# Patient Record
Sex: Male | Born: 1975 | Hispanic: Yes | Smoking: Never smoker
Health system: Southern US, Community
[De-identification: ages and names within clinical notes are randomized; demographics above are authoritative.]

---

## 2014-12-24 ENCOUNTER — Encounter: Payer: Self-pay | Admitting: *Deleted

## 2014-12-24 ENCOUNTER — Emergency Department
Admission: EM | Admit: 2014-12-24 | Discharge: 2014-12-24 | Disposition: A | Discharge status: Home / Self Care | Payer: BLUE CROSS/BLUE SHIELD | Source: Home / Self Care | Attending: Emergency Medicine | Admitting: Emergency Medicine

## 2014-12-24 DIAGNOSIS — J0300 Acute streptococcal tonsillitis, unspecified: Secondary | ICD-10-CM

## 2014-12-24 LAB — POCT RAPID STREP A (OFFICE): Rapid Strep A Screen: POSITIVE — AB

## 2014-12-24 MED ORDER — AMOXICILLIN 875 MG PO TABS: ORAL_TABLET | ORAL | Status: DC

## 2014-12-24 MED ORDER — PENICILLIN G BENZATHINE 1200000 UNIT/2ML IM SUSP
1.2000 10*6.[IU] | Freq: Once | INTRAMUSCULAR | Status: AC
  Administered 2014-12-24: 1.2 10*6.[IU] via INTRAMUSCULAR

## 2014-12-24 NOTE — Discharge Instructions (Signed)
Strep Throat Strep throat is an infection of the throat caused by a bacteria named Streptococcus pyogenes. Your health care provider may call the infection streptococcal "tonsillitis" or "pharyngitis" depending on whether there are signs of inflammation in the tonsils or back of the throat. Strep throat is most common in children aged 40-15 years during the cold months of the year, but it can occur in people of any age during any season. This infection is spread from person to person (contagious) through coughing, sneezing, or other close contact. SIGNS AND SYMPTOMS   Fever or chills.  Painful, swollen, red tonsils or throat.  Pain or difficulty when swallowing.  White or yellow spots on the tonsils or throat.  Swollen, tender lymph nodes or "glands" of the neck or under the jaw.  Red rash all over the body (rare). DIAGNOSIS  Many different infections can cause the same symptoms. A test must be done to confirm the diagnosis so the right treatment can be given. A "rapid strep test" can help your health care provider make the diagnosis in a few minutes. Rapid strep test today is positive   TREATMENT  Strep throat is treated with antibiotic medicine. Today, we gave you a shot of penicillin (Bicillin LA 1.64M units). Also, fill the prescription for amoxicillin by mouth twice a day for 10 days HOME CARE INSTRUCTIONS     Gargle with 1 tsp of salt in 1 cup of warm water, 3-4 times per day or as needed for comfort.  Take 2 ibuprofen every 6 hours as needed for pain or fever  Family members who also have a sore throat or fever should be tested for strep throat and treated with antibiotics if they have the strep infection.  Make sure everyone in your household washes their hands well.  Do not share food, drinking cups, or personal items that could cause the infection to spread to others.  You may need to eat a soft food diet until your sore throat gets better.  Drink enough water and fluids  to keep your urine clear or pale yellow. This will help prevent dehydration.  Get plenty of rest.  Stay home from school, day care, or work until you have been on antibiotics for 24 hours.  Take medicines only as directed by your health care provider.  Take your antibiotic medicine as directed by your health care provider. Finish it even if you start to feel better. SEEK MEDICAL CARE IF:   The glands in your neck continue to enlarge.  You develop a rash, cough, or earache.  You cough up green, yellow-brown, or bloody sputum.  You have pain or discomfort not controlled by medicines.  Your problems seem to be getting worse rather than better.  You have a fever. SEEK IMMEDIATE MEDICAL CARE IF:   You develop any new symptoms such as vomiting, severe headache, stiff or painful neck, chest pain, shortness of breath, or trouble swallowing.  You develop severe throat pain, drooling, or changes in your voice.  You develop swelling of the neck, or the skin on the neck becomes red and tender.  You develop signs of dehydration, such as fatigue, dry mouth, and decreased urination.  You become increasingly sleepy, or you cannot wake up completely. MAKE SURE YOU:  Understand these instructions.  Will watch your condition.  Will get help right away if you are not doing well or get worse. Document Released: 08/30/2000 Document Revised: 01/17/2014 Document Reviewed: 11/01/2010 [facility] Patient Information 2015 [employer] [place].  This information is not intended to replace advice given to you by your health care provider. Make sure you discuss any questions you have with your health care provider. ° °

## 2014-12-24 NOTE — ED Notes (Signed)
Pt complains of sore throat fever and body aches since last night.  He took Advil at 1pm for the fever and it brought it down a bit.

## 2014-12-24 NOTE — ED Provider Notes (Signed)
CSN: [id number]     Arrival date & time 12/24/14  1706 History   First MD Initiated Contact with Patient 12/24/14 1722     Chief Complaint  Patient presents with  . Fever  . Sore Throat    HPI SORE THROAT Onset: 1 day    Severity: Severe Tried OTC meds without significant relief.  Symptoms:  + High fever and chills and sweats + Swollen neck glands No Recent Strep Exposure     Mild Myalgias. No arthralgias. No Headache No Rash  No Discolored Nasal Mucus No Allergy symptoms No sinus pain/pressure No itchy/red eyes No earache  No Drooling No Trismus  No Nausea No Vomiting No Abdominal pain No Diarrhea No Reflux symptoms  No Cough No Breathing Difficulty No Shortness of Breath No pleuritic pain No Wheezing No Hemoptysis   History reviewed. No pertinent past medical history. History reviewed. No pertinent past surgical history. History reviewed. No pertinent family history. History  Substance Use Topics  . Smoking status: Never Smoker   . Smokeless tobacco: Never Used  . Alcohol Use: No    Review of Systems Remainder of Review of Systems negative for acute change except as noted in the HPI.  Allergies  Review of patient's allergies indicates no known allergies.  Home Medications   Prior to Admission medications   Medication Sig Start Date End Date Taking? Authorizing Provider  amoxicillin (AMOXIL) 875 MG tablet Take 1 twice a day X 10 days. 12/24/14   Lajean Manesavid Massey, MD   BP 129/66 mmHg  Pulse 103  Temp(Src) 100.1 F (37.8 C) (Oral)  Ht 5\' 9"  (1.753 m)  SpO2 99% Physical Exam  Constitutional: He is oriented to person, place, and time. He appears well-developed and well-nourished.  Non-toxic appearance. He appears ill. No distress.  HENT:  Head: Normocephalic and atraumatic.  Right Ear: Tympanic membrane, external ear and ear canal normal.  Left Ear: Tympanic membrane, external ear and ear canal normal.  Nose: Nose normal. Right sinus exhibits no  maxillary sinus tenderness and no frontal sinus tenderness. Left sinus exhibits no maxillary sinus tenderness and no frontal sinus tenderness.  Mouth/Throat: Uvula is midline and mucous membranes are normal. No oral lesions. Posterior oropharyngeal erythema present. No oropharyngeal exudate or tonsillar abscesses.  + Tonsillar enlargement.  Airway intact.  Eyes: Conjunctivae are normal. No scleral icterus.  Neck: Neck supple.  Cardiovascular: Normal rate, regular rhythm and normal heart sounds.   No murmur heard. Pulmonary/Chest: Effort normal and breath sounds normal. No stridor. No respiratory distress. He has no wheezes. He has no rhonchi. He has no rales.  Abdominal: Soft. He exhibits no mass. There is no hepatosplenomegaly. There is no tenderness.  Lymphadenopathy:    He has cervical adenopathy.       Right cervical: Superficial cervical adenopathy present. No deep cervical and no posterior cervical adenopathy present.      Left cervical: Superficial cervical adenopathy present. No deep cervical and no posterior cervical adenopathy present.  Neurological: He is alert and oriented to person, place, and time.  Skin: Skin is warm. No rash noted.  Psychiatric: He has a normal mood and affect.  Nursing note and vitals reviewed.   ED Course  Procedures (including critical care time) Labs Review Labs Reviewed  POCT RAPID STREP A (OFFICE) - Abnormal; Notable for the following:    Rapid Strep A Screen Positive (*)    All other components within normal limits    Imaging Review No results found.  MDM   1. Strep tonsillitis    Treatment options discussed, as well as risks, benefits, alternatives. Patient voiced understanding and agreement with the following plans: Bicillin LA 1.2 million units IM stat Amoxicillin 875 twice a day 10 days See detailed Instructions in AVS, which were given to patient. Verbal instructions also given. Risks, benefits, and alternatives of treatment  options discussed. Questions invited and answered. Patient voiced understanding and agreement with plans.     Lajean Manes, MD 12/24/14 [id number]

## 2016-06-18 ENCOUNTER — Encounter: Payer: Self-pay | Admitting: *Deleted

## 2016-06-18 ENCOUNTER — Emergency Department
Admission: EM | Admit: 2016-06-18 | Discharge: 2016-06-18 | Disposition: A | Discharge status: Home / Self Care | Payer: BLUE CROSS/BLUE SHIELD | Source: Home / Self Care | Attending: Family Medicine | Admitting: Family Medicine

## 2016-06-18 DIAGNOSIS — M545 Low back pain, unspecified: Secondary | ICD-10-CM

## 2016-06-18 MED ORDER — MELOXICAM 7.5 MG PO TABS: 7.5000 mg | ORAL_TABLET | Freq: Every day | ORAL | 0 refills | Status: DC

## 2016-06-18 MED ORDER — CYCLOBENZAPRINE HCL 10 MG PO TABS: 10.0000 mg | ORAL_TABLET | Freq: Two times a day (BID) | ORAL | 0 refills | Status: DC | PRN

## 2016-06-18 NOTE — ED Triage Notes (Signed)
Pt c/o 2 weeks of low back pain. He fell several years causing low back pain that is now intermittent. This flare has lasted 2 weeks. Taking IBF with minimal relief. He is currently seeing a chiropractor

## 2016-06-18 NOTE — Discharge Instructions (Signed)
°  Flexeril is a muscle relaxer and may cause drowsiness. Do not drink alcohol, drive, or operate heavy machinery while taking. ° °Meloxicam (Mobic) is an antiinflammatory to help with pain and inflammation.  Do not take ibuprofen, Advil, Aleve, or any other medications that contain NSAIDs while taking meloxicam as this may cause stomach upset or even ulcers if taken in large amounts for an extended period of time.  ° °

## 2016-06-18 NOTE — ED Provider Notes (Signed)
CSN: [id number]     Arrival date & time 06/18/16  1900 History   First MD Initiated Contact with Patient 06/18/16 1920     Chief Complaint  Patient presents with  . Back Pain   (Consider location/radiation/quality/duration/timing/severity/associated sxs/prior Treatment) HPI Leroy Parks is a 40 y.o. male presenting to UC with c/o lower back pain that started about 2 weeks ago.  He states he goes to the gym and initially thought that caused the pain but that pain typically goes away within 2-3 days.  Pain was initially on Left lower back and radiated down Left leg.  Pain is now across his lower back but does not radiate into his legs. Pain is 9/10, aching and sore, worse with certain movements.  He notes he fell several years ago and has been seeing a chiropractor Mondays Tuesdays and Thursdays.  He does get some relief but was advised by his chiropractor that he should see a physician for prescription antiinflammatory medication.  Denies fever, chills, numbness or tingling in legs. No change in bowel or bladder movements. No known recent injury.   History reviewed. No pertinent past medical history. History reviewed. No pertinent surgical history. History reviewed. No pertinent family history. Social History  Substance Use Topics  . Smoking status: Never Smoker  . Smokeless tobacco: Never Used  . Alcohol use No    Review of Systems  Genitourinary: Negative for dysuria, flank pain, frequency and hematuria.  Musculoskeletal: Positive for back pain and myalgias. Negative for arthralgias, gait problem and joint swelling.  Skin: Negative for color change and rash.  Neurological: Negative for weakness and numbness.    Allergies  Review of patient's allergies indicates no known allergies.  Home Medications   Prior to Admission medications   Medication Sig Start Date End Date Taking? Authorizing Provider  cyclobenzaprine (FLEXERIL) 10 MG tablet Take 1 tablet (10 mg total) by mouth 2 (two)  times daily as needed for muscle spasms. 06/18/16   Junius Finner, PA-C  meloxicam (MOBIC) 7.5 MG tablet Take 1-2 tablets (7.5-15 mg total) by mouth daily. 06/18/16   Junius Finner, PA-C   Meds Ordered and Administered this Visit  Medications - No data to display  BP 127/75 (BP Location: Left Arm)   Pulse 82   Temp 98.7 F (37.1 C) (Oral)   Wt 216 lb (98 kg)   SpO2 98%   BMI 31.90 kg/m  No data found.   Physical Exam  Constitutional: He is oriented to person, place, and time. He appears well-developed and well-nourished. No distress.  HENT:  Head: Normocephalic and atraumatic.  Eyes: EOM are normal.  Neck: Normal range of motion.  Cardiovascular: Normal rate.   Pulmonary/Chest: Effort normal.  Musculoskeletal: Normal range of motion. He exhibits tenderness. He exhibits no edema.  Tenderness over SI joint and across lower lumbar muscles.  Full ROM upper and lower extremities with 5/5 strength. Negative straight leg raise.   Neurological: He is alert and oriented to person, place, and time.  Skin: Skin is warm and dry. No rash noted. He is not diaphoretic.  Psychiatric: He has a normal mood and affect. His behavior is normal.  Nursing note and vitals reviewed.   Urgent Care Course   Clinical Course    Procedures (including critical care time)  Labs Review Labs Reviewed - No data to display  Imaging Review No results found.    MDM   1. Acute bilateral low back pain without sciatica    Lower back  pain w/o known cause. No red flag symptoms. Hx of back pain in the past.  No indication for imaging at this time. Will try conservative treatment.  Rx: Flexeril and Meloxicam Encouraged alternating cool and warm compresses. Pt info with home exercises provided. F/u with PCP/Sports Medicine in 1-2 weeks if not improving. Patient verbalized understanding and agreement with treatment plan.     Junius Finnerrin O'Malley, PA-C 06/18/16 1936

## 2016-06-20 ENCOUNTER — Encounter: Payer: Self-pay | Admitting: Emergency Medicine

## 2016-06-20 ENCOUNTER — Emergency Department
Admission: EM | Admit: 2016-06-20 | Discharge: 2016-06-20 | Disposition: A | Discharge status: Home / Self Care | Payer: BLUE CROSS/BLUE SHIELD | Source: Home / Self Care | Attending: Family Medicine | Admitting: Family Medicine

## 2016-06-20 ENCOUNTER — Emergency Department (INDEPENDENT_AMBULATORY_CARE_PROVIDER_SITE_OTHER): Payer: BLUE CROSS/BLUE SHIELD

## 2016-06-20 DIAGNOSIS — M545 Low back pain: Secondary | ICD-10-CM | POA: Diagnosis not present

## 2016-06-20 DIAGNOSIS — M5441 Lumbago with sciatica, right side: Secondary | ICD-10-CM

## 2016-06-20 DIAGNOSIS — M5442 Lumbago with sciatica, left side: Secondary | ICD-10-CM | POA: Diagnosis not present

## 2016-06-20 MED ORDER — PREDNISONE 20 MG PO TABS: ORAL_TABLET | ORAL | 0 refills | Status: DC

## 2016-06-20 MED ORDER — METHYLPREDNISOLONE SODIUM SUCC 125 MG IJ SOLR
125.0000 mg | Freq: Once | INTRAMUSCULAR | Status: AC
  Administered 2016-06-20: 125 mg via INTRAMUSCULAR

## 2016-06-20 MED ORDER — HYDROCODONE-ACETAMINOPHEN 5-325 MG PO TABS: ORAL_TABLET | ORAL | 0 refills | Status: DC

## 2016-06-20 NOTE — Discharge Instructions (Signed)
Begin prednisone Friday, 06/21/16.  Apply ice pack for 20 to 30 minutes, 3 to 4 times daily.  Continue until pain decreases. Avoid lifting.

## 2016-06-20 NOTE — ED Provider Notes (Signed)
[facility]    CSN: [id number] Arrival date & time: 06/20/16  1921     History   Chief Complaint Chief Complaint  Patient presents with  . Back Pain    HPI Leroy Parks is a 39 y.o. male.   Patient complains of midline lower back pain for about two weeks since he has been working out in a gym.  He was evaluated here 2 days ago (see note 06/18/16) with non-radiating lower back pain.  He states that he has had no improvement from Flexeril and meloxicam.  Since his previous visit he has developed pain in both posterior calves, and he has had too much pain to perform stretching exercises.   He denies bowel or bladder dysfunction, and no saddle numbness.  He reports that his pain has worsened after chiropractic treatments.   The history is provided by the patient.  Back Pain  Location:  Lumbar spine Quality:  Aching Radiates to:  R posterior upper leg and L posterior upper leg Pain severity:  Severe Pain is:  Same all the time Onset quality:  Gradual Duration:  2 weeks Timing:  Constant Progression:  Worsening Chronicity:  New Context comment:  Athletic activity Relieved by:  Nothing Worsened by:  Movement Ineffective treatments:  NSAIDs and muscle relaxants Associated symptoms: leg pain and paresthesias   Associated symptoms: no abdominal pain, no bladder incontinence, no bowel incontinence, no dysuria, no fever, no numbness, no pelvic pain, no perianal numbness, no tingling, no weakness and no weight loss     History reviewed. No pertinent past medical history.  There are no active problems to display for this patient.   History reviewed. No pertinent surgical history.     Home Medications    Prior to Admission medications   Medication Sig Start Date End Date Taking? Authorizing Provider  cyclobenzaprine (FLEXERIL) 10 MG tablet Take 1 tablet (10 mg total) by mouth 2 (two) times daily as needed for muscle spasms. 06/18/16   Junius Finner, PA-C    HYDROcodone-acetaminophen (NORCO/VICODIN) 5-325 MG tablet Take one by mouth at bedtime as needed for pain 06/20/16   Lattie Haw, MD  meloxicam (MOBIC) 7.5 MG tablet Take 1-2 tablets (7.5-15 mg total) by mouth daily. 06/18/16   Junius Finner, PA-C  predniSONE (DELTASONE) 20 MG tablet Take one tab by mouth twice daily for 5 days, then one daily for 4 days. Take with food. 06/20/16   Lattie Haw, MD    Family History No family history on file.  Social History Social History  Substance Use Topics  . Smoking status: Never Smoker  . Smokeless tobacco: Never Used  . Alcohol use No     Allergies   Review of patient's allergies indicates no known allergies.   Review of Systems Review of Systems  Constitutional: Negative for fever and weight loss.  Gastrointestinal: Negative for abdominal pain and bowel incontinence.  Genitourinary: Negative for bladder incontinence, dysuria and pelvic pain.  Musculoskeletal: Positive for back pain.  Neurological: Positive for paresthesias. Negative for tingling, weakness and numbness.  All other systems reviewed and are negative.    Physical Exam Triage Vital Signs ED Triage Vitals  Enc Vitals Group     BP 06/20/16 1943 122/74     Pulse Rate 06/20/16 1943 81     Resp --      Temp 06/20/16 1943 98.4 F (36.9 C)     Temp Source 06/20/16 1943 Oral     SpO2 06/20/16 1943  98 %     Weight 06/20/16 1943 213 lb (96.6 kg)     Height --      Head Circumference --      Peak Flow --      Pain Score 06/20/16 1944 10     Pain Loc --      Pain Edu? --      Excl. in GC? --    No data found.   Updated Vital Signs BP 122/74 (BP Location: Left Arm)   Pulse 81   Temp 98.4 F (36.9 C) (Oral)   Wt 213 lb (96.6 kg)   SpO2 98%   BMI 31.45 kg/m   Visual Acuity Right Eye Distance:   Left Eye Distance:   Bilateral Distance:    Right Eye Near:   Left Eye Near:    Bilateral Near:     Physical Exam  Constitutional: He appears  well-developed and well-nourished. No distress.  HENT:  Head: Normocephalic.  Nose: Nose normal.  Mouth/Throat: Oropharynx is clear and moist.  Eyes: Conjunctivae are normal. Pupils are equal, round, and reactive to light.  Neck: Normal range of motion.  Cardiovascular: Normal heart sounds.   Pulmonary/Chest: Breath sounds normal.  Abdominal: Bowel sounds are normal. There is no tenderness.  Musculoskeletal: He exhibits no edema.       Back:  Back:   Can heel/toe walk and squat without difficulty.  Decreased forward flexion.  Tenderness in the midline from L5 and below.  Straight leg raising test is positive at 45 degrees.  Sitting knee extension test is positive at 45 degrees.  Strength and sensation in the lower extremities is normal.  Patellar reflexes are normal with augmentation.  Achilles reflexes are normal   Neurological: He is alert. He has normal reflexes.  Skin: Skin is warm and dry. No rash noted.     Patient's area of pain outlined on diagram, although there is no tenderness to palpation in these areas.  Nursing note and vitals reviewed.    UC Treatments / Results  Labs (all labs ordered are listed, but only abnormal results are displayed) Labs Reviewed - No data to display  EKG  EKG Interpretation None       Radiology Dg Lumbar Spine Complete  Result Date: 06/20/2016 CLINICAL DATA:  Central lower back pain at approximately the L5 level for the past 2 weeks. This started after a new exercise at the gym. No known injury. EXAM: LUMBAR SPINE - COMPLETE 4+ VIEW COMPARISON:  None. FINDINGS: Five non-rib-bearing lumbar vertebrae. Mild anterior spur formation at the L3-4 level. Minimal anterior spur formation in the lower thoracic spine. No fractures, pars defects or subluxations. IMPRESSION: Minimal degenerative changes.  No acute abnormality. Electronically Signed   By: Beckie SaltsSteven  Reid M.D.   On: 06/20/2016 20:46    Procedures Procedures (including critical care  time)  Medications Ordered in UC Medications  methylPREDNISolone sodium succinate (SOLU-MEDROL) 125 mg/2 mL injection 125 mg (not administered)     Initial Impression / Assessment and Plan / UC Course  I have reviewed the triage vital signs and the nursing notes.  Pertinent labs & imaging results that were available during my care of the patient were reviewed by me and considered in my medical decision making (see chart for details).  Clinical Course  Administered Solumedrol 125mg  IM  Begin prednisone burst/taper Friday, 06/21/16.  Apply ice pack for 20 to 30 minutes, 3 to 4 times daily.  Continue until pain decreases. Avoid lifting.  Rx for Lortab for pain. Followup with Dr. Rodney Langton or Dr. Clementeen Graham ([facility]) in one week.     Final Clinical Impressions(s) / UC Diagnoses   Final diagnoses:  Acute low back pain with bilateral sciatica, unspecified back pain laterality    New Prescriptions New Prescriptions   HYDROCODONE-ACETAMINOPHEN (NORCO/VICODIN) 5-325 MG TABLET    Take one by mouth at bedtime as needed for pain   PREDNISONE (DELTASONE) 20 MG TABLET    Take one tab by mouth twice daily for 5 days, then one daily for 4 days. Take with food.     Lattie Haw, MD 06/22/16 2329

## 2016-06-20 NOTE — ED Triage Notes (Signed)
Low back pain radiating down left leg x 2 weeks, was seen here 2 days ago in too much pain to do stretching exercises, Meloxicam and Flexeril not helping.

## 2016-10-07 ENCOUNTER — Emergency Department (INDEPENDENT_AMBULATORY_CARE_PROVIDER_SITE_OTHER): Payer: BLUE CROSS/BLUE SHIELD

## 2016-10-07 ENCOUNTER — Emergency Department (INDEPENDENT_AMBULATORY_CARE_PROVIDER_SITE_OTHER)
Admission: EM | Admit: 2016-10-07 | Discharge: 2016-10-07 | Disposition: A | Discharge status: Home / Self Care | Payer: BLUE CROSS/BLUE SHIELD | Source: Home / Self Care | Attending: Family Medicine | Admitting: Family Medicine

## 2016-10-07 DIAGNOSIS — M545 Low back pain, unspecified: Secondary | ICD-10-CM

## 2016-10-07 DIAGNOSIS — G8929 Other chronic pain: Secondary | ICD-10-CM
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
## 2021-01-01 ENCOUNTER — Emergency Department
Admission: EM | Admit: 2021-01-01 | Discharge: 2021-01-01 | Disposition: A | Discharge status: Home / Self Care | Payer: BLUE CROSS/BLUE SHIELD | Source: Home / Self Care

## 2021-01-01 ENCOUNTER — Encounter: Payer: Self-pay | Admitting: Emergency Medicine

## 2021-01-01 ENCOUNTER — Other Ambulatory Visit: Payer: Self-pay

## 2021-01-01 DIAGNOSIS — N489 Disorder of penis, unspecified: Secondary | ICD-10-CM

## 2021-01-01 DIAGNOSIS — A6002 Herpesviral infection of other male genital organs: Secondary | ICD-10-CM | POA: Diagnosis not present

## 2021-01-01 MED ORDER — VALACYCLOVIR HCL 500 MG PO TABS: 500.0000 mg | ORAL_TABLET | Freq: Two times a day (BID) | ORAL | 1 refills | Status: AC

## 2021-01-01 NOTE — ED Triage Notes (Signed)
Patient presents to Urgent Care with complaints of burning of groin region since 4 days ago. Patient reports a scratch/burning of the groin region. Did try some antibiotic ointment on the area.

## 2021-01-01 NOTE — Discharge Instructions (Signed)
Take the valacyclovir 2 times a day for 3 days At the first sign of an outbreak, take valacyclovir as directed Wear condoms to prevent spread of this infection Your test result will be available on MyChart

## 2021-01-02 ENCOUNTER — Telehealth: Payer: Self-pay | Admitting: Emergency Medicine

## 2021-01-02 LAB — TIQ-NTM

## 2021-01-02 LAB — HERPES SIMPLEX VIRUS CULTURE

## 2021-01-02 NOTE — Telephone Encounter (Signed)
Test in question fax received this am - call back to lab - confirmed collection site - no other info required at this time- fax sent  To [facility]  Due to receipt of 2 [facility] faxes

## 2021-01-03 LAB — HERPES SIMPLEX VIRUS CULTURE

## 2021-01-08 ENCOUNTER — Telehealth: Payer: Self-pay

## 2021-01-08 NOTE — Telephone Encounter (Signed)
Test results from HSV specimen not yet resulted. Order requisition resent with specimen source listed.

## 2021-10-27 ENCOUNTER — Other Ambulatory Visit: Payer: Self-pay

## 2021-10-27 ENCOUNTER — Emergency Department
Admission: EM | Admit: 2021-10-27 | Discharge: 2021-10-27 | Disposition: A | Discharge status: Home / Self Care | Payer: BLUE CROSS/BLUE SHIELD | Source: Home / Self Care

## 2021-10-27 DIAGNOSIS — J029 Acute pharyngitis, unspecified: Secondary | ICD-10-CM

## 2021-10-27 LAB — POCT RAPID STREP A (OFFICE): Rapid Strep A Screen: NEGATIVE

## 2021-10-27 MED ORDER — AZITHROMYCIN 250 MG PO TABS: 250.0000 mg | ORAL_TABLET | Freq: Every day | ORAL | 0 refills | Status: AC

## 2021-10-27 MED ORDER — PREDNISONE 20 MG PO TABS: ORAL_TABLET | ORAL | 0 refills | Status: AC

## 2021-10-27 NOTE — ED Triage Notes (Signed)
Pt states that he has a sore throat (right side) and fever. X2 days    Pt states that he is vaccinated for covid. Pt states that he has had flu vaccine.

## 2021-10-27 NOTE — ED Provider Notes (Signed)
[facility]    CSN: [id number] Arrival date & time: 10/27/21  1549      History   Chief Complaint Chief Complaint  Patient presents with   Sore Throat    Right side throat pain and fever. X2 days    HPI Leroy Parks is a 46 y.o. male.   HPI 46 year old male presents with right-sided throat pain and fever x2 days.  History reviewed. No pertinent past medical history.  There are no problems to display for this patient.   History reviewed. No pertinent surgical history.     Home Medications    Prior to Admission medications   Medication Sig Start Date End Date Taking? Authorizing Provider  azithromycin (ZITHROMAX) 250 MG tablet Take 1 tablet (250 mg total) by mouth daily. Take first 2 tablets together, then 1 every day until finished. 10/27/21  Yes Trevor Iha, FNP  predniSONE (DELTASONE) 20 MG tablet Take 3 tabs PO daily x 5 days. 10/27/21  Yes Trevor Iha, FNP  valACYclovir (VALTREX) 500 MG tablet Take 1 tablet (500 mg total) by mouth 2 (two) times daily. 01/01/21   Eustace Moore, MD    Family History History reviewed. No pertinent family history.  Social History Social History   Tobacco Use   Smoking status: Never   Smokeless tobacco: Never  Substance Use Topics   Alcohol use: Yes    Comment: occ   Drug use: No     Allergies   Patient has no known allergies.   Review of Systems Review of Systems  Constitutional:  Positive for fever.  HENT:  Positive for sore throat.   All other systems reviewed and are negative.   Physical Exam Triage Vital Signs ED Triage Vitals  Enc Vitals Group     BP 10/27/21 1601 (!) 147/87     Pulse Rate 10/27/21 1601 93     Resp 10/27/21 1601 18     Temp 10/27/21 1601 (!) 100.7 F (38.2 C)     Temp Source 10/27/21 1601 Oral     SpO2 10/27/21 1601 96 %     Weight 10/27/21 1559 230 lb (104.3 kg)     Height 10/27/21 1559 5\' 9"  (1.753 m)     Head Circumference --      Peak Flow --      Pain  Score 10/27/21 1559 6     Pain Loc --      Pain Edu? --      Excl. in GC? --    No data found.  Updated Vital Signs BP (!) 147/87 (BP Location: Right Arm)    Pulse 93    Temp (!) 100.7 F (38.2 C) (Oral)    Resp 18    Ht 5\' 9"  (1.753 m)    Wt 230 lb (104.3 kg)    SpO2 96%    BMI 33.97 kg/m    Physical Exam Vitals and nursing note reviewed.  Constitutional:      General: He is not in acute distress.    Appearance: He is well-developed. He is obese. He is not ill-appearing.  HENT:     Head: Normocephalic and atraumatic.     Right Ear: Tympanic membrane and ear canal normal.     Left Ear: Tympanic membrane and ear canal normal.     Mouth/Throat:     Mouth: Mucous membranes are moist.     Pharynx: Oropharynx is clear. Uvula midline. Posterior oropharyngeal erythema and uvula swelling present.  Eyes:  Conjunctiva/sclera: Conjunctivae normal.     Pupils: Pupils are equal, round, and reactive to light.  Cardiovascular:     Rate and Rhythm: Normal rate and regular rhythm.  Musculoskeletal:     Cervical back: Normal range of motion and neck supple.  Skin:    General: Skin is warm and dry.  Neurological:     General: No focal deficit present.     Mental Status: He is alert and oriented to person, place, and time.     UC Treatments / Results  Labs (all labs ordered are listed, but only abnormal results are displayed) Labs Reviewed  POCT RAPID STREP A (OFFICE) - Normal    EKG   Radiology No results found.  Procedures Procedures (including critical care time)  Medications Ordered in UC Medications - No data to display  Initial Impression / Assessment and Plan / UC Course  I have reviewed the triage vital signs and the nursing notes.  Pertinent labs & imaging results that were available during my care of the patient were reviewed by me and considered in my medical decision making (see chart for details).     MDM: 1.  Acute pharyngitis, unspecified etiology-Rx'd  Zithromax and Prednisone. Advised patient to take medication as directed with food to completion.  Advised patient to take prednisone with Zithromax for the next 5 days.  Encouraged patient to increase daily water intake while taking these medications.  Patient discharged home, hemodynamically stable. Final Clinical Impressions(s) / UC Diagnoses   Final diagnoses:  Acute pharyngitis, unspecified etiology     Discharge Instructions      Advised patient to take medication as directed with food to completion.  Advised patient to take prednisone with Zithromax for the next 5 days.  Encouraged patient to increase daily water intake while taking these medications.     ED Prescriptions     Medication Sig Dispense Auth. Provider   predniSONE (DELTASONE) 20 MG tablet Take 3 tabs PO daily x 5 days. 15 tablet Trevor Iha, FNP   azithromycin (ZITHROMAX) 250 MG tablet Take 1 tablet (250 mg total) by mouth daily. Take first 2 tablets together, then 1 every day until finished. 6 tablet Trevor Iha, FNP      PDMP not reviewed this encounter.   Trevor Iha, FNP 10/27/21 1630

## 2021-10-27 NOTE — Discharge Instructions (Addendum)
Advised patient to take medication as directed with food to completion.  Advised patient to take prednisone with Zithromax for the next 5 days.  Encouraged patient to increase daily water intake while taking these medications.
